# Patient Record
Sex: Male | Born: 1965 | Race: White | Hispanic: No | Marital: Married | Smoking: Never smoker
Health system: Southern US, Community
[De-identification: ages and names within clinical notes are randomized; demographics above are authoritative.]

## PROBLEM LIST (undated history)

## (undated) DIAGNOSIS — S21339A Puncture wound without foreign body of unspecified front wall of thorax with penetration into thoracic cavity, initial encounter: Secondary | ICD-10-CM

## (undated) DIAGNOSIS — W3400XA Accidental discharge from unspecified firearms or gun, initial encounter: Secondary | ICD-10-CM

## (undated) DIAGNOSIS — Z87442 Personal history of urinary calculi: Secondary | ICD-10-CM

## (undated) HISTORY — PX: APPENDECTOMY: SHX54

---
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
## 2008-03-28 ENCOUNTER — Ambulatory Visit (HOSPITAL_COMMUNITY): Admission: RE | Admit: 2008-03-28 | Discharge: 2008-03-28 | Payer: Self-pay | Admitting: Urology

## 2011-03-01 NOTE — Op Note (Signed)
NAME:  Peter Rice, Peter Rice              ACCOUNT NO.:  [id number]   MEDICAL RECORD NO.:  [id number]          PATIENT TYPE:  AMB   LOCATION:  DAY                          FACILITY:  [facility]   PHYSICIAN:  Courtney Paris, M.D.DATE OF BIRTH:  04-22-1966   DATE OF PROCEDURE:  03/28/2008  DATE OF DISCHARGE:                               OPERATIVE REPORT   PREOPERATIVE DIAGNOSIS:  Left distal ureteral stone.   POSTOPERATIVE DIAGNOSIS:  Left distal ureteral stone.   OPERATIONS:  Cysto, ureteroscopy with stone extraction.   ANESTHESIA:  General.   SURGEON:  Courtney Paris, M.D.   BRIEF HISTORY:  This 45 year old patient has had pain since March 25, 2008, in the left flank.  He had not had a previous stones.  He  presented with a 4-mm stone in his left distal ureter.  He was offered  ureteroscopy yesterday, but chose to try the past the stone on his own,  but came into the office today just not doing well.  He continued to  have nausea and vomiting and significant pain.  KUB showed the stone  still present in the left distal ureter.  He enters now for ureteroscopy  and stone extraction.  He does have tiny bilateral nonobstructing stones  as well.  This is his first stone has passed, however.   DESCRIPTION OF PROCEDURE:  The patient was placed on the operating table  in the dorsal lithotomy position.  After satisfactory induction of  general endotracheal anesthesia, he was prepped and draped with Betadine  in the usual sterile fashion.  A time-out was done and the patient's  identification and side were again reconfirmed.  He was given IV Cipro.  The panendoscope could not be inserted without dilating the meatus from  18-26 [place], and then the 24 panendoscope was inserted.  No anterior  strictures seen.  The posterior urethra was nonobstructing.  The bladder  was entered.  The stone was seen coming out of the left ureteral  orifice.  Using the ureteroscope, we teased the stone out  and removed it  intact.  I did not leave a stent.  The bladder was drained and the scope  removed.  There was some old blood clots present in the bladder from  behind the stone once it passed.  B&O suppository was inserted.  The  patient was taken to the recovery room in good condition to be later  discharged as an outpatient.      Courtney Paris, M.D.  Electronically Signed     HMK/MEDQ  D:  03/28/2008  T:  03/29/2008  Job:  [id number]

## 2011-07-14 LAB — BASIC METABOLIC PANEL
Chloride: 102
GFR calc Af Amer: 53 — ABNORMAL LOW
Potassium: 4.2
Sodium: 138

## 2011-07-14 LAB — CBC
HCT: 39.6
Hemoglobin: 13.7
MCV: 85.5
RBC: 4.63
WBC: 9.1

## 2015-08-03 ENCOUNTER — Other Ambulatory Visit: Payer: Self-pay | Admitting: Family Medicine

## 2015-08-03 DIAGNOSIS — R1084 Generalized abdominal pain: Secondary | ICD-10-CM

## 2015-08-04 ENCOUNTER — Other Ambulatory Visit: Payer: Self-pay | Admitting: Family Medicine

## 2015-08-04 DIAGNOSIS — R1084 Generalized abdominal pain: Secondary | ICD-10-CM

## 2015-08-06 ENCOUNTER — Other Ambulatory Visit: Payer: Self-pay

## 2015-08-10 ENCOUNTER — Ambulatory Visit
Admission: RE | Admit: 2015-08-10 | Discharge: 2015-08-10 | Disposition: A | Discharge status: Home / Self Care | Payer: BLUE CROSS/BLUE SHIELD | Source: Ambulatory Visit | Attending: Family Medicine | Admitting: Family Medicine

## 2015-08-10 ENCOUNTER — Other Ambulatory Visit: Payer: Self-pay | Admitting: Family Medicine

## 2015-08-10 DIAGNOSIS — R1084 Generalized abdominal pain: Secondary | ICD-10-CM

## 2019-02-12 ENCOUNTER — Encounter: Payer: BLUE CROSS/BLUE SHIELD | Admitting: Sports Medicine

## 2019-06-09 ENCOUNTER — Other Ambulatory Visit: Payer: Self-pay

## 2019-06-09 ENCOUNTER — Encounter (HOSPITAL_COMMUNITY): Payer: Self-pay | Admitting: Emergency Medicine

## 2019-06-09 ENCOUNTER — Emergency Department (HOSPITAL_COMMUNITY)
Admission: EM | Admit: 2019-06-09 | Discharge: 2019-06-10 | Disposition: A | Discharge status: Home / Self Care | Payer: BC Managed Care – PPO | Attending: Emergency Medicine | Admitting: Emergency Medicine

## 2019-06-09 DIAGNOSIS — N23 Unspecified renal colic: Secondary | ICD-10-CM | POA: Diagnosis not present

## 2019-06-09 DIAGNOSIS — R1032 Left lower quadrant pain: Secondary | ICD-10-CM | POA: Diagnosis present

## 2019-06-09 DIAGNOSIS — R823 Hemoglobinuria: Secondary | ICD-10-CM | POA: Diagnosis not present

## 2019-06-09 HISTORY — DX: Puncture wound without foreign body of unspecified front wall of thorax with penetration into thoracic cavity, initial encounter: S21.339A

## 2019-06-09 LAB — URINALYSIS, ROUTINE W REFLEX MICROSCOPIC
Bacteria, UA: NONE SEEN
Bilirubin Urine: NEGATIVE
Glucose, UA: NEGATIVE mg/dL
Ketones, ur: NEGATIVE mg/dL
Leukocytes,Ua: NEGATIVE
Nitrite: NEGATIVE
Protein, ur: NEGATIVE mg/dL
RBC / HPF: >50 RBC/hpf — ABNORMAL HIGH (ref 0–5)
Specific Gravity, Urine: 1.018 (ref 1.005–1.030)
pH: 6 (ref 5.0–8.0)

## 2019-06-09 MED ORDER — TAMSULOSIN HCL 0.4 MG PO CAPS
0.4000 mg | ORAL_CAPSULE | Freq: Once | ORAL | Status: AC
  Administered 2019-06-09: 0.4 mg via ORAL
  Filled 2019-06-09: qty 1
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
## 2019-06-09 NOTE — ED Triage Notes (Signed)
Pt reports left side flank pain with feeling similar pain to a kidney stone. Pt has prior hx to kidney stone.

## 2019-06-10 MED ORDER — ONDANSETRON HCL 4 MG PO TABS: 4.0000 mg | ORAL_TABLET | Freq: Four times a day (QID) | ORAL | 0 refills | Status: AC

## 2019-06-10 MED ORDER — HYDROCODONE-ACETAMINOPHEN 5-325 MG PO TABS: 1.0000 | ORAL_TABLET | Freq: Four times a day (QID) | ORAL | 0 refills | Status: DC | PRN

## 2019-06-10 MED ORDER — TAMSULOSIN HCL 0.4 MG PO CAPS: 0.4000 mg | ORAL_CAPSULE | Freq: Every day | ORAL | 0 refills | Status: AC

## 2019-06-10 NOTE — Discharge Instructions (Addendum)
Thank you for allowing me to care for you today in the Emergency Department.   Urine and symptoms today were strongly suggestive of a kidney stone.  Take 1 tablet of tamsulosin by mouth daily.  Your first dose was given tonight in the ER.  You can take 600 mg of ibuprofen with food or 650 mg of Tylenol once every 6 hours for mild to moderate pain.  For severe, uncontrollable pain, you can take 1 to 2 tablets of Norco every 6 hours as needed.  Do not work or drive while taking this medication as it can cause you to be impaired.  Do not take other sedating substances while taking this medication.  It is narcotic.  It can be addicting.  If you do pass the stone, you can use the urine strainer to catch it and save it for analysis.  Return to the ER if you develop uncontrollable pain, if you stop producing urine, if you develop high fevers or chills, persistent vomiting despite taking Zofran, or other new, concerning symptoms.

## 2019-06-11 ENCOUNTER — Other Ambulatory Visit: Payer: Self-pay | Admitting: Urology

## 2019-06-11 ENCOUNTER — Ambulatory Visit (HOSPITAL_COMMUNITY): Payer: BC Managed Care – PPO

## 2019-06-11 ENCOUNTER — Ambulatory Visit (HOSPITAL_COMMUNITY): Payer: BC Managed Care – PPO | Admitting: Anesthesiology

## 2019-06-11 ENCOUNTER — Encounter (HOSPITAL_COMMUNITY): Payer: Self-pay | Admitting: General Practice

## 2019-06-11 ENCOUNTER — Ambulatory Visit (HOSPITAL_COMMUNITY)
Admission: RE | Admit: 2019-06-11 | Discharge: 2019-06-11 | Disposition: A | Discharge status: Home / Self Care | Payer: BC Managed Care – PPO | Source: Other Acute Inpatient Hospital | Attending: Urology | Admitting: Urology

## 2019-06-11 ENCOUNTER — Other Ambulatory Visit: Payer: Self-pay

## 2019-06-11 ENCOUNTER — Encounter (HOSPITAL_COMMUNITY)
Admission: RE | Disposition: A | Discharge status: Home / Self Care | Payer: Self-pay | Source: Other Acute Inpatient Hospital | Attending: Urology

## 2019-06-11 ENCOUNTER — Encounter (HOSPITAL_COMMUNITY): Payer: Self-pay | Admitting: *Deleted

## 2019-06-11 ENCOUNTER — Other Ambulatory Visit (HOSPITAL_COMMUNITY)
Admission: RE | Admit: 2019-06-11 | Discharge: 2019-06-11 | Disposition: A | Discharge status: Home / Self Care | Payer: BC Managed Care – PPO | Source: Ambulatory Visit | Attending: Urology | Admitting: Urology

## 2019-06-11 DIAGNOSIS — N281 Cyst of kidney, acquired: Secondary | ICD-10-CM | POA: Diagnosis not present

## 2019-06-11 DIAGNOSIS — Z20828 Contact with and (suspected) exposure to other viral communicable diseases: Secondary | ICD-10-CM | POA: Insufficient documentation

## 2019-06-11 DIAGNOSIS — N35911 Unspecified urethral stricture, male, meatal: Secondary | ICD-10-CM | POA: Insufficient documentation

## 2019-06-11 DIAGNOSIS — Z87442 Personal history of urinary calculi: Secondary | ICD-10-CM | POA: Insufficient documentation

## 2019-06-11 DIAGNOSIS — N201 Calculus of ureter: Secondary | ICD-10-CM | POA: Diagnosis present

## 2019-06-11 DIAGNOSIS — N200 Calculus of kidney: Secondary | ICD-10-CM

## 2019-06-11 HISTORY — PX: CYSTOSCOPY W/ URETERAL STENT PLACEMENT: SHX1429

## 2019-06-11 HISTORY — DX: Personal history of urinary calculi: Z87.442

## 2019-06-11 LAB — SARS CORONAVIRUS 2 BY RT PCR (HOSPITAL ORDER, PERFORMED IN ~~LOC~~ HOSPITAL LAB): SARS Coronavirus 2: NEGATIVE

## 2019-06-11 SURGERY — CYSTOSCOPY, WITH RETROGRADE PYELOGRAM AND URETERAL STENT INSERTION
Anesthesia: General | Site: Ureter

## 2019-06-11 MED ORDER — PROPOFOL 10 MG/ML IV BOLUS
INTRAVENOUS | Status: DC | PRN
  Administered 2019-06-11: 180 mg via INTRAVENOUS

## 2019-06-11 MED ORDER — MIDAZOLAM HCL 2 MG/2ML IJ SOLN
INTRAMUSCULAR | Status: AC
  Filled 2019-06-11: qty 2

## 2019-06-11 MED ORDER — HYDROMORPHONE HCL 1 MG/ML IJ SOLN: 0.2500 mg | INTRAMUSCULAR | Status: DC | PRN

## 2019-06-11 MED ORDER — FENTANYL CITRATE (PF) 100 MCG/2ML IJ SOLN
INTRAMUSCULAR | Status: DC | PRN
  Administered 2019-06-11 (×2): 50 ug via INTRAVENOUS

## 2019-06-11 MED ORDER — TAMSULOSIN HCL 0.4 MG PO CAPS: 0.4000 mg | ORAL_CAPSULE | Freq: Every day | ORAL | 0 refills | Status: DC

## 2019-06-11 MED ORDER — ONDANSETRON HCL 4 MG/2ML IJ SOLN: 4.0000 mg | Freq: Once | INTRAMUSCULAR | Status: DC | PRN

## 2019-06-11 MED ORDER — CIPROFLOXACIN HCL 500 MG PO TABS: 500.0000 mg | ORAL_TABLET | Freq: Once | ORAL | 0 refills | Status: AC

## 2019-06-11 MED ORDER — PROPOFOL 10 MG/ML IV BOLUS
INTRAVENOUS | Status: AC
  Filled 2019-06-11: qty 20

## 2019-06-11 MED ORDER — CEFAZOLIN SODIUM-DEXTROSE 2-4 GM/100ML-% IV SOLN
2.0000 g | INTRAVENOUS | Status: DC
  Filled 2019-06-11: qty 100

## 2019-06-11 MED ORDER — SODIUM CHLORIDE 0.9 % IV SOLN
INTRAVENOUS | Status: DC | PRN
  Administered 2019-06-11: 4 mL

## 2019-06-11 MED ORDER — MIDAZOLAM HCL 5 MG/5ML IJ SOLN
INTRAMUSCULAR | Status: DC | PRN
  Administered 2019-06-11: 2 mg via INTRAVENOUS

## 2019-06-11 MED ORDER — FENTANYL CITRATE (PF) 100 MCG/2ML IJ SOLN
INTRAMUSCULAR | Status: AC
  Filled 2019-06-11: qty 2

## 2019-06-11 MED ORDER — LACTATED RINGERS IV SOLN
INTRAVENOUS | Status: DC
  Administered 2019-06-11: 16:00:00 via INTRAVENOUS

## 2019-06-11 MED ORDER — LIDOCAINE 2% (20 MG/ML) 5 ML SYRINGE
INTRAMUSCULAR | Status: DC | PRN
  Administered 2019-06-11: 50 mg via INTRAVENOUS

## 2019-06-11 MED ORDER — TRAMADOL HCL 50 MG PO TABS: 50.0000 mg | ORAL_TABLET | Freq: Four times a day (QID) | ORAL | 0 refills | Status: AC | PRN

## 2019-06-11 MED ORDER — MEPERIDINE HCL 50 MG/ML IJ SOLN: 6.2500 mg | INTRAMUSCULAR | Status: DC | PRN

## 2019-06-11 MED ORDER — SODIUM CHLORIDE 0.9 % IR SOLN
Status: DC | PRN
  Administered 2019-06-11: 1000 mL

## 2019-06-11 MED ORDER — ONDANSETRON HCL 4 MG/2ML IJ SOLN
INTRAMUSCULAR | Status: DC | PRN
  Administered 2019-06-11: 4 mg via INTRAVENOUS

## 2019-06-11 MED ORDER — DEXAMETHASONE SODIUM PHOSPHATE 10 MG/ML IJ SOLN
INTRAMUSCULAR | Status: DC | PRN
  Administered 2019-06-11: 10 mg via INTRAVENOUS

## 2019-06-11 SURGICAL SUPPLY — 24 items
APL SKNCLS STERI-STRIP NONHPOA (GAUZE/BANDAGES/DRESSINGS) ×1
BAG URO CATCHER STRL LF (MISCELLANEOUS) ×3 IMPLANT
BASKET ZERO TIP NITINOL 2.4FR (BASKET) IMPLANT
BENZOIN TINCTURE PRP APPL 2/3 (GAUZE/BANDAGES/DRESSINGS) ×2 IMPLANT
BSKT STON RTRVL ZERO TP 2.4FR (BASKET)
CATH URET 5FR 28IN OPEN ENDED (CATHETERS) ×3 IMPLANT
CLOTH BEACON ORANGE TIMEOUT ST (SAFETY) ×3 IMPLANT
COVER WAND RF STERILE (DRAPES) IMPLANT
DRSG TEGADERM 2-3/8X2-3/4 SM (GAUZE/BANDAGES/DRESSINGS) ×3 IMPLANT
EXTRACTOR STONE 1.7FRX115CM (UROLOGICAL SUPPLIES) IMPLANT
GLOVE BIOGEL M STRL SZ7.5 (GLOVE) ×3 IMPLANT
GOWN STRL REUS W/TWL XL LVL3 (GOWN DISPOSABLE) ×3 IMPLANT
GUIDEWIRE ANG ZIPWIRE 038X150 (WIRE) IMPLANT
GUIDEWIRE STR DUAL SENSOR (WIRE) ×3 IMPLANT
KIT TURNOVER KIT A (KITS) IMPLANT
MANIFOLD NEPTUNE II (INSTRUMENTS) ×3 IMPLANT
PACK CYSTO (CUSTOM PROCEDURE TRAY) ×3 IMPLANT
SHEATH URETERAL 12FRX28CM (UROLOGICAL SUPPLIES) IMPLANT
SHEATH URETERAL 12FRX35CM (MISCELLANEOUS) IMPLANT
STENT URET 6FRX28 CONTOUR (STENTS) ×2 IMPLANT
TUBING CONNECTING 10 (TUBING) ×2 IMPLANT
TUBING CONNECTING 10' (TUBING) ×1
TUBING UROLOGY SET (TUBING) ×3 IMPLANT
WIRE COONS/BENSON .038X145CM (WIRE) IMPLANT

## 2019-06-11 NOTE — Anesthesia Preprocedure Evaluation (Signed)
Anesthesia Evaluation  Patient identified by MRN, date of birth, ID band Patient awake    Reviewed: Allergy & Precautions, NPO status , Patient's Chart, lab work & pertinent test results  Airway Mallampati: I  TM Distance: >3 FB Neck ROM: Full    Dental   Pulmonary    Pulmonary exam normal        Cardiovascular Normal cardiovascular exam     Neuro/Psych    GI/Hepatic   Endo/Other    Renal/GU      Musculoskeletal   Abdominal   Peds  Hematology   Anesthesia Other Findings   Reproductive/Obstetrics                             Anesthesia Physical Anesthesia Plan  ASA: II  Anesthesia Plan: General   Post-op Pain Management:    Induction: Intravenous  PONV Risk Score and Plan: 2  Airway Management Planned: LMA  Additional Equipment:   Intra-op Plan:   Post-operative Plan: Extubation in OR  Informed Consent: I have reviewed the patients History and Physical, chart, labs and discussed the procedure including the risks, benefits and alternatives for the proposed anesthesia with the patient or authorized representative who has indicated his/her understanding and acceptance.     Plan Discussed with: CRNA and Surgeon  Anesthesia Plan Comments:         Anesthesia Quick Evaluation  

## 2019-06-11 NOTE — Op Note (Signed)
Preoperative diagnosis:  1. Obstructing left UPJ stone with uncontrolled pain 2. Urethral meatus stenosis   Postoperative diagnosis:  1. same   Procedure:  1. Cystoscopy 2. left ureteral stent placement 3. left retrograde pyelography with interpretation  4. Urethral meatus dilation  Surgeon: Ardis Hughs, MD  Anesthesia: General  Complications: None  Intraoperative findings:  left retrograde pyelography demonstrated a filling defect within the left  UPJ consistent with the patient's known calculus without other abnormalities.  Stone was pushed back into the renal pelvis. Meatus to narrow to accommodate cystoscope and was dilated to 19f.  EBL: Minimal  Specimens: None  Indication: Peter Rice is a 53 y.o. patient with poorly controlled pain from a left UPJ stone. After reviewing the management options for treatment, he elected to proceed with the above surgical procedure(s). We have discussed the potential benefits and risks of the procedure, side effects of the proposed treatment, the likelihood of the patient achieving the goals of the procedure, and any potential problems that might occur during the procedure or recuperation. Informed consent has been obtained.  Description of procedure:  The patient was taken to the operating room and general anesthesia was induced.  The patient was placed in the dorsal lithotomy position, prepped and draped in the usual sterile fashion, and preoperative antibiotics were administered. A preoperative time-out was performed.   Unable to advance the 38f cystoscopy I dilated his urethral meatus using VanBuren sounds in a serial fashion starting at 56F and dilating upto 43F.  Cystourethroscopy was performed.  The patient's urethra was examined and was normal and demonstrated bilobar prostatic hypertrophy. The bladder was then systematically examined in its entirety. There was no evidence for any bladder tumors, stones, or other mucosal  pathology.    Attention then turned to the leftureteral orifice and a ureteral catheter was used to intubate the ureteral orifice.  Omnipaque contrast was injected through the ureteral catheter and a retrograde pyelogram was performed with findings as dictated above.  A 0.38 sensor guidewire was then advanced up the left ureter into the renal pelvis under fluoroscopic guidance.  The wire was then backloaded through the cystoscope and a ureteral stent was advance over the wire using Seldinger technique.  The stent was positioned appropriately under fluoroscopic and cystoscopic guidance.  The wire was then removed with an adequate stent curl noted in the renal pelvis as well as in the bladder.  The bladder was then emptied and the procedure ended.  The patient appeared to tolerate the procedure well and without complications.  The patient was able to be awakened and transferred to the recovery unit in satisfactory condition.    Ardis Hughs, M.D.

## 2019-06-11 NOTE — H&P (Signed)
I have pain in the flank.  HPI: Peter Rice is a 53 year-old male established patient who is here for flank pain.  06/10/19: The patient is seen today having been seen in the emergency room last night. He had some mild left lower quadrant pain that developed yesterday and then it became very severe and was not associated with any ability to get relief by change in position and notes that the pain is similar to his previous stone that he had in 6/0 9. At that time a CT scan revealed bilateral, small renal calculi. He has not had any hematuria nor has a seen any change in his voiding pattern.  A renal ultrasound in 10/16 revealed a 6 mm stone in the left kidney as well as bilateral renal cysts.  He is mainly bothered by pain in the left lower quadrant now. No hematuria. He said the pain is identical to what he had experienced with his previous stone but he said that was more in the flank and this is more in the left lower quadrant.   06/11/19: Patient with above noted hx. CT imaging from 8/24 revealed a 10 mm wide by 11 mm long stone located at the UPJ on the left. He has elected proceed with lithotripsy, which has not been scheduled yet. He presents today with refractory left flank pain. He states that his pain became more acute at approximately 1:00 a.m.. He used to hydrocodone-acetaminophen 5-325 at that time with limited benefit. He repeated dose of pain medicine at approximately 5:30 a.m.. However, he states that pain continues to progress. He currently states pain at an 11/10. He has had some intermittent episodes of nausea but denies vomiting. He has had some chills, but denies measurable fever. He denies exacerbation of lower urinary tract symptoms or dysuria. He has had prior URS in 2009.   The problem is on the left side. His pain started about 06/09/2019. The pain is sharp. The pain is intermittent. The pain does radiate. The intensity of his pain is rated as a 10.   None< makes the pain better.  Nothing causes the pain to become worse. He was treated with the following pain medication(s): Hydrocodone.   He has had this same pain previously. He has had kidney stones. This condition would be considered of mild to moderate severity with no modifying factors or associated signs or symptoms other than as noted above.     ALLERGIES: No Allergies    MEDICATIONS: Tamsulosin Hcl  Hydrocodone-Acetaminophen 5 mg-325 mg tablet  Naproxen 1 PO Daily     GU PSH: Ureteroscopic stone removal - 2009       PSH Notes: Cystoscopy With Ureteroscopy With Removal Of Calculus, Sinus Surgery, Abdominal Surgery, Hand Surgery, Tonsillectomy, Appendectomy, Thoracic Repair, gum surgery, wisdom tooth extraction   NON-GU PSH: Appendectomy - 2009 Remove Tonsils - 2009     GU PMH: Flank Pain (Acute), Left, His flank pain and left lower quadrant pain is secondary to his left UPJ stone. - 06/10/2019 Renal cyst, Bilateral, His bilateral renal cysts appear simple - 06/10/2019 LLQ pain, Abdominal pain, LLQ (left lower quadrant) - 2014 Renal calculus, Bilateral kidney stones - 2014 Ureteral calculus, Calculus of ureter - 2014      PMH Notes:  1898-10-17 00:00:00 - Note: Normal Routine History And Physical Adult   NON-GU PMH: Nausea, Nausea - 2014 Other constipation, Chronic Constipation - 2014 GERD    FAMILY HISTORY: None    Notes: Mother still living at  age 23  Father still living at age 66  Has 3 daughters   SOCIAL HISTORY: Marital Status: Married Current Smoking Status: Patient has never smoked.   Tobacco Use Assessment Completed: Used Tobacco in last 30 days? Does drink.  Patient's occupation Museum/gallery conservator.     Notes: Caffeine Use   REVIEW OF SYSTEMS:    GU Review Male:   Patient denies frequent urination, get up at night to urinate, erection problems, stream starts and stops, hard to postpone urination, leakage of urine, have to strain to urinate , trouble starting your stream, burning/  pain with urination, and penile pain.  Gastrointestinal (Upper):   Patient denies nausea, vomiting, and indigestion/ heartburn.  Gastrointestinal (Lower):   Patient denies diarrhea and constipation.  Constitutional:   Patient denies fever, night sweats, weight loss, and fatigue.  Skin:   Patient denies skin rash/ lesion and itching.  Eyes:   Patient denies blurred vision and double vision.  Ears/ Nose/ Throat:   Patient denies sore throat and sinus problems.  Hematologic/Lymphatic:   Patient denies swollen glands and easy bruising.  Cardiovascular:   Patient denies leg swelling and chest pains.  Respiratory:   Patient denies cough and shortness of breath.  Endocrine:   Patient denies excessive thirst.  Musculoskeletal:   Patient denies back pain and joint pain.  Neurological:   Patient denies headaches and dizziness.  Psychologic:   Patient denies depression and anxiety.   Notes: left flank and LLQ abdominal pain unrelieved by pain medicine (last dose taken at 0530)    VITAL SIGNS:      06/11/2019 09:17 AM  BP 137/80 mmHg  Pulse 66 /min  Temperature 96.8 F / 36 C   MULTI-SYSTEM PHYSICAL EXAMINATION:    Constitutional: Well-nourished. No physical deformities. Normally developed. Good grooming. Appears uncomfortable. Pacing around the room.   Respiratory: No labored breathing, no use of accessory muscles.   Cardiovascular: Normal temperature, normal extremity pulses, no swelling, no varicosities.  Neurologic / Psychiatric: Oriented to time, oriented to place, oriented to person. No depression, no anxiety, no agitation.  Gastrointestinal: No mass, no tenderness, no rigidity, non obese abdomen. Left CVAT.   Musculoskeletal: Normal gait and station of head and neck.     PAST DATA REVIEWED:  Source Of History:  Patient  Records Review:   Previous Patient Records  Urine Test Review:   Urinalysis  X-Ray Review: C.T. Abdomen/Pelvis: Reviewed Films. Reviewed Report.     PROCEDURES:           Urinalysis w/Scope Dipstick Dipstick Cont'd Micro  Color: Amber Bilirubin: Neg mg/dL WBC/hpf: 0 - 5/hpf  Appearance: Cloudy Ketones: Neg mg/dL RBC/hpf: >09/QZR  Specific Gravity: 1.020 Blood: 3+ ery/uL Bacteria: Few (10-25/hpf)  pH: 6.5 Protein: Trace mg/dL Cystals: NS (Not Seen)  Glucose: Neg mg/dL Urobilinogen: 0.2 mg/dL Casts: NS (Not Seen)    Nitrites: Neg Trichomonas: Not Present    Leukocyte Esterase: Trace leu/uL Mucous: Not Present      Epithelial Cells: NS (Not Seen)      Yeast: NS (Not Seen)      Sperm: Not Present         Morphine 8mg  - [id number], [id number] two separate injections- angel gave morphine on the left buttock and cj on the right buttock. patient laid for 15 min and tolerated well.    Qty: 8 Adm. By: Samara Deist  Unit: mg Lot No [id number]  Route: IM Exp. Date 08/17/2020  Freq: None Mfgr.: [id number]  Site: bilateral  Phenergan 25mg  - [id number], [id number] The injection site was sterilely prepped with alcohol. Phenergan was injected (IM) using standard technique. The patient tolerated the procedure well. A band aid was applied. The site was dry when the patient left the exam room.   After treatment was administered, patient was observed to be symptom-free for 10-15 minutes.   Qty: 25 Adm. By: Beryle QuantAngel Bryant  Unit: mg Lot No [id number]  Route: IM Exp. Date 07/18/2019  Freq: None Mfgr.:   Site: Right Buttock   ASSESSMENT:      ICD-10 Details  1 GU:   Flank Pain - R10.84   2   Renal calculus - N20.0    PLAN:            Medications New Meds: Hydromorphone Hcl 4 mg tablet 1 tablet PO Q 4 H   #20  0 Refill(s)            Orders Labs Urine Culture          Schedule Procedure: 06/11/2019 at [facility] - [phone] - Phenergan 25mg  (Phenergan Per 50 Mg) - [id number], [id number]  Procedure: 06[facility]11/2019 at [facility] - [phone] - Morphine 4mg  (Ther/Proph/Diag Inj, /Im) - [id number], [id number]          Document Letter(s):  Created  for Patient: Clinical Summary         Notes:   I will send urine for culture today. Patient was given injections of morphine and Finder urine in clinic today with limited benefit in symptoms. I reviewed options with the patient in detail today. He would like for intervention to be considered given refractory pain. Case reviewed with the on-call urologist. Will plan to move forward with cystoscopy and left ureteral stent placement. I reviewed the procedure with the patient today. He has had ureteroscopy prior. We will plan to also get him set up for left ESWL later this week for definitive treatment of his left UPJ calculus. He will remain NPO and present for procedure later this afternoon.

## 2019-06-11 NOTE — Anesthesia Procedure Notes (Signed)
Procedure Name: LMA Insertion Date/Time: 06/11/2019 4:26 PM Performed by: Anne Fu, CRNA Pre-anesthesia Checklist: Patient identified, Emergency Drugs available, Suction available, Patient being monitored and Timeout performed Patient Re-evaluated:Patient Re-evaluated prior to induction Oxygen Delivery Method: Circle system utilized Preoxygenation: Pre-oxygenation with 100% oxygen Induction Type: IV induction Ventilation: Mask ventilation without difficulty LMA: LMA inserted LMA Size: 4.0 Number of attempts: 1 Placement Confirmation: positive ETCO2 and breath sounds checked- equal and bilateral Tube secured with: Tape

## 2019-06-11 NOTE — Anesthesia Postprocedure Evaluation (Signed)
Anesthesia Post Note  Patient: Peter Rice  Procedure(s) Performed: CYSTOSCOPY WITH LEFT RETROGRADE PYELOGRAM/URETERAL STENT PLACEMENT (N/A Ureter)     Patient location during evaluation: PACU Anesthesia Type: General Level of consciousness: awake and alert Pain management: pain level controlled Vital Signs Assessment: post-procedure vital signs reviewed and stable Respiratory status: spontaneous breathing, nonlabored ventilation, respiratory function stable and patient connected to nasal cannula oxygen Cardiovascular status: blood pressure returned to baseline and stable Postop Assessment: no apparent nausea or vomiting Anesthetic complications: no    Last Vitals:  Vitals:   06/11/19 1715 06/11/19 1730  BP: (!) 132/92 138/90  Pulse: 83 86  Resp: 16 15  Temp:  36.5 C  SpO2: 98% 98%    Last Pain:  Vitals:   06/11/19 1730  TempSrc:   PainSc: 0-No pain                 Shaft Corigliano DAVID

## 2019-06-11 NOTE — Transfer of Care (Signed)
Immediate Anesthesia Transfer of Care Note  Patient: Peter Rice  Procedure(s) Performed: Procedure(s): CYSTOSCOPY WITH LEFT RETROGRADE PYELOGRAM/URETERAL STENT PLACEMENT (N/A)  Patient Location: PACU  Anesthesia Type:General  Level of Consciousness:  sedated, patient cooperative and responds to stimulation  Airway & Oxygen Therapy:Patient Spontanous Breathing and Patient connected to face mask oxgen  Post-op Assessment:  Report given to PACU RN and Post -op Vital signs reviewed and stable  Post vital signs:  Reviewed and stable  Last Vitals:  Vitals:   06/11/19 1507  BP: (!) 144/103  Pulse: 91  Resp: 20  Temp: 36.8 C  SpO2: 825%    Complications: No apparent anesthesia complications

## 2019-06-11 NOTE — Interval H&P Note (Signed)
History and Physical Interval Note:  06/11/2019 4:06 PM  Peter Rice  has presented today for surgery, with the diagnosis of left ureteral calculus.  The various methods of treatment have been discussed with the patient and family. After consideration of risks, benefits and other options for treatment, the patient has consented to  Procedure(s): CYSTOSCOPY WITH LEFT RETROGRADE PYELOGRAM/URETERAL STENT PLACEMENT (N/A) as a surgical intervention.  The patient's history has been reviewed, patient examined, no change in status, stable for surgery.  I have reviewed the patient's chart and labs.  Questions were answered to the patient's satisfaction.     Ardis Hughs

## 2019-06-11 NOTE — Discharge Instructions (Signed)
DISCHARGE INSTRUCTIONS FOR KIDNEY STONE/URETERAL STENT   MEDICATIONS:  1. Resume all your other meds from home - except do not take any extra narcotic pain meds that you may have at home.  2. Tramadol is for moderate/severe pain, otherwise taking upto 1000 mg every 6 hours of plainTylenol will help treat your pain.   3. Take Cipro one hour prior to removal of your stent.   ACTIVITY:  1. No strenuous activity x 1week  2. No driving while on narcotic pain medications  3. Drink plenty of water  4. Continue to walk at home - you can still get blood clots when you are at home, so keep active, but don't over do it.  5. May return to work/school tomorrow or when you feel ready   BATHING:  1. You can shower and we recommend daily showers  2. You have a string coming from your urethra: The stent string is attached to your ureteral stent. Do not pull on this.   SIGNS/SYMPTOMS TO CALL:  Please call Peter Rice if you have a fever greater than 101.5, uncontrolled nausea/vomiting, uncontrolled pain, dizziness, unable to urinate, bloody urine, chest pain, shortness of breath, leg swelling, leg pain, redness around wound, drainage from wound, or any other concerns or questions.   You can reach Peter Rice at [phone].   FOLLOW-UP:  1. You have an appointment for lithotripsy of your stone on Thursday. 2. You have a string attached to your stent, you may remove it on Monday, August 31. To do this, pull the strings until the stents are completely removed. You may feel an odd sensation in your back.   General Anesthesia, Adult, Care After This sheet gives you information about how to care for yourself after your procedure. Your health care provider may also give you more specific instructions. If you have problems or questions, contact your health care provider. What can I expect after the procedure? After the procedure, the following side effects are common:  Pain or discomfort at the IV  site.  Nausea.  Vomiting.  Sore throat.  Trouble concentrating.  Feeling cold or chills.  Weak or tired.  Sleepiness and fatigue.  Soreness and body aches. These side effects can affect parts of the body that were not involved in surgery. Follow these instructions at home:  For at least 24 hours after the procedure:  Have a responsible adult stay with you. It is important to have someone help care for you until you are awake and alert.  Rest as needed.  Do not: ? Participate in activities in which you could fall or become injured. ? Drive. ? Use heavy machinery. ? Drink alcohol. ? Take sleeping pills or medicines that cause drowsiness. ? Make important decisions or sign legal documents. ? Take care of children on your own. Eating and drinking  Follow any instructions from your health care provider about eating or drinking restrictions.  When you feel hungry, start by eating small amounts of foods that are soft and easy to digest (bland), such as toast. Gradually return to your regular diet.  Drink enough fluid to keep your urine pale yellow.  If you vomit, rehydrate by drinking water, juice, or clear broth. General instructions  If you have sleep apnea, surgery and certain medicines can increase your risk for breathing problems. Follow instructions from your health care provider about wearing your sleep device: ? Anytime you are sleeping, including during daytime naps. ? While taking prescription pain medicines, sleeping medicines, or medicines  that make you drowsy.  Return to your normal activities as told by your health care provider. Ask your health care provider what activities are safe for you.  Take over-the-counter and prescription medicines only as told by your health care provider.  If you smoke, do not smoke without supervision.  Keep all follow-up visits as told by your health care provider. This is important. Contact a health care provider if:  You  have nausea or vomiting that does not get better with medicine.  You cannot eat or drink without vomiting.  You have pain that does not get better with medicine.  You are unable to pass urine.  You develop a skin rash.  You have a fever.  You have redness around your IV site that gets worse. Get help right away if:  You have difficulty breathing.  You have chest pain.  You have blood in your urine or stool, or you vomit blood. Summary  After the procedure, it is common to have a sore throat or nausea. It is also common to feel tired.  Have a responsible adult stay with you for the first 24 hours after general anesthesia. It is important to have someone help care for you until you are awake and alert.  When you feel hungry, start by eating small amounts of foods that are soft and easy to digest (bland), such as toast. Gradually return to your regular diet.  Drink enough fluid to keep your urine pale yellow.  Return to your normal activities as told by your health care provider. Ask your health care provider what activities are safe for you. This information is not intended to replace advice given to you by your health care provider. Make sure you discuss any questions you have with your health care provider. Document Released: 01/09/2001 Document Revised: 10/06/2017 Document Reviewed: 05/19/2017 Elsevier Patient Education  2020 [employer]

## 2019-06-12 ENCOUNTER — Encounter (HOSPITAL_COMMUNITY): Payer: Self-pay | Admitting: Urology

## 2019-06-12 NOTE — H&P (Signed)
Office Visit Report     06/11/2019   --------------------------------------------------------------------------------   Peter Rice  MRN: [id number]  DOB: 11/27/1965, 53 year old Male  SSN: [id number]   PRIMARY CARE:    REFERRING:  Jama Flavors. Redmond Pulling, MD  PROVIDER:  Kathie Rhodes, M.D.  TREATING:  Azucena Fallen, NP  LOCATION:  [facility] [phone]     --------------------------------------------------------------------------------   CC: I have pain in the flank.  HPI: Peter Rice is a 53 year-old male established patient who is here for flank pain.  06/10/19: The patient is seen today having been seen in the emergency room last night. He had some mild left lower quadrant pain that developed yesterday and then it became very severe and was not associated with any ability to get relief by change in position and notes that the pain is similar to his previous stone that he had in 6/0 9. At that time a CT scan revealed bilateral, small renal calculi. He has not had any hematuria nor has a seen any change in his voiding pattern.  A renal ultrasound in 10/16 revealed a 6 mm stone in the left kidney as well as bilateral renal cysts.  He is mainly bothered by pain in the left lower quadrant now. No hematuria. He said the pain is identical to what he had experienced with his previous stone but he said that was more in the flank and this is more in the left lower quadrant.   06/11/19: Patient with above noted hx. CT imaging from 8/24 revealed a 10 mm wide by 11 mm long stone located at the UPJ on the left. He has elected proceed with lithotripsy, which has not been scheduled yet. He presents today with refractory left flank pain. He states that his pain became more acute at approximately 1:00 a.m.. He used to hydrocodone-acetaminophen 5-325 at that time with limited benefit. He repeated dose of pain medicine at approximately 5:30 a.m.. However, he states that pain continues to progress.  He currently states pain at an 11/10. He has had some intermittent episodes of nausea but denies vomiting. He has had some chills, but denies measurable fever. He denies exacerbation of lower urinary tract symptoms or dysuria. He has had prior URS in 2009.   The problem is on the left side. His pain started about 06/09/2019. The pain is sharp. The pain is intermittent. The pain does radiate. The intensity of his pain is rated as a 10.   None< makes the pain better. Nothing causes the pain to become worse. He was treated with the following pain medication(s): Hydrocodone.   He has had this same pain previously. He has had kidney stones. This condition would be considered of mild to moderate severity with no modifying factors or associated signs or symptoms other than as noted above.     ALLERGIES: No Allergies    MEDICATIONS: Tamsulosin Hcl  Hydrocodone-Acetaminophen 5 mg-325 mg tablet  Naproxen 1 PO Daily     GU PSH: Ureteroscopic stone removal - 2009       PSH Notes: Cystoscopy With Ureteroscopy With Removal Of Calculus, Sinus Surgery, Abdominal Surgery, Hand Surgery, Tonsillectomy, Appendectomy, Thoracic Repair, gum surgery, wisdom tooth extraction   NON-GU PSH: Appendectomy - 2009 Remove Tonsils - 2009     GU PMH: Flank Pain (Acute), Left, His flank pain and left lower quadrant pain is secondary to his left UPJ stone. - 06/10/2019 Renal cyst, Bilateral, His bilateral renal cysts appear simple -  06/10/2019 LLQ pain, Abdominal pain, LLQ (left lower quadrant) - 2014 Renal calculus, Bilateral kidney stones - 2014 Ureteral calculus, Calculus of ureter - 2014      PMH Notes:  1898-10-17 00:00:00 - Note: Normal Routine History And Physical Adult   NON-GU PMH: Nausea, Nausea - 2014 Other constipation, Chronic Constipation - 2014 GERD    FAMILY HISTORY: None    Notes: Mother still living at age 59  Father still living at age 87  Has 3 daughters   SOCIAL HISTORY: Marital Status:  Married Current Smoking Status: Patient has never smoked.   Tobacco Use Assessment Completed: Used Tobacco in last 30 days? Does drink.  Patient's occupation Museum/gallery conservator.     Notes: Caffeine Use   REVIEW OF SYSTEMS:    GU Review Male:   Patient denies frequent urination, get up at night to urinate, erection problems, stream starts and stops, hard to postpone urination, leakage of urine, have to strain to urinate , trouble starting your stream, burning/ pain with urination, and penile pain.  Gastrointestinal (Upper):   Patient denies nausea, vomiting, and indigestion/ heartburn.  Gastrointestinal (Lower):   Patient denies diarrhea and constipation.  Constitutional:   Patient denies fever, night sweats, weight loss, and fatigue.  Skin:   Patient denies skin rash/ lesion and itching.  Eyes:   Patient denies blurred vision and double vision.  Ears/ Nose/ Throat:   Patient denies sore throat and sinus problems.  Hematologic/Lymphatic:   Patient denies swollen glands and easy bruising.  Cardiovascular:   Patient denies leg swelling and chest pains.  Respiratory:   Patient denies cough and shortness of breath.  Endocrine:   Patient denies excessive thirst.  Musculoskeletal:   Patient denies back pain and joint pain.  Neurological:   Patient denies headaches and dizziness.  Psychologic:   Patient denies depression and anxiety.   Notes: left flank and LLQ abdominal pain unrelieved by pain medicine (last dose taken at 0530)    VITAL SIGNS:      06/11/2019 09:17 AM  BP 137/80 mmHg  Pulse 66 /min  Temperature 96.8 F / 36 C   MULTI-SYSTEM PHYSICAL EXAMINATION:    Constitutional: Well-nourished. No physical deformities. Normally developed. Good grooming. Appears uncomfortable. Pacing around the room.   Respiratory: No labored breathing, no use of accessory muscles.   Cardiovascular: Normal temperature, normal extremity pulses, no swelling, no varicosities.  Neurologic / Psychiatric:  Oriented to time, oriented to place, oriented to person. No depression, no anxiety, no agitation.  Gastrointestinal: No mass, no tenderness, no rigidity, non obese abdomen. Left CVAT.   Musculoskeletal: Normal gait and station of head and neck.     PAST DATA REVIEWED:  Source Of History:  Patient  Records Review:   Previous Patient Records  Urine Test Review:   Urinalysis  X-Ray Review: C.T. Abdomen/Pelvis: Reviewed Films. Reviewed Report.     PROCEDURES:          Urinalysis w/Scope Dipstick Dipstick Cont'd Micro  Color: Amber Bilirubin: Neg mg/dL WBC/hpf: 0 - 5/hpf  Appearance: Cloudy Ketones: Neg mg/dL RBC/hpf: >22/QJF  Specific Gravity: 1.020 Blood: 3+ ery/uL Bacteria: Few (10-25/hpf)  pH: 6.5 Protein: Trace mg/dL Cystals: NS (Not Seen)  Glucose: Neg mg/dL Urobilinogen: 0.2 mg/dL Casts: NS (Not Seen)    Nitrites: Neg Trichomonas: Not Present    Leukocyte Esterase: Trace leu/uL Mucous: Not Present      Epithelial Cells: NS (Not Seen)      Yeast: NS (Not Seen)  Sperm: Not Present         Morphine 8mg  - [id number], J2270 two separate injections- angel gave morphine on the left buttock and cj on the right buttock. patient laid for 15 min and tolerated well.    Qty: 8 Adm. By: Samara Deistindy Jackson  Unit: mg Lot No [id number]  Route: IM Exp. Date 08/17/2020  Freq: None Mfgr.: [id number]  Site: bilateral         Phenergan 25mg  - [id number], J2550 The injection site was sterilely prepped with alcohol. Phenergan was injected (IM) using standard technique. The patient tolerated the procedure well. A band aid was applied. The site was dry when the patient left the exam room.   After treatment was administered, patient was observed to be symptom-free for 10-15 minutes.   Qty: 25 Adm. By: Beryle QuantAngel Bryant  Unit: mg Lot No [id number]  Route: IM Exp. Date 07/18/2019  Freq: None Mfgr.:   Site: Right Buttock   ASSESSMENT:      ICD-10 Details  1 GU:   Flank Pain - R10.84   2   Renal calculus -  N20.0    PLAN:            Medications New Meds: Hydromorphone Hcl 4 mg tablet 1 tablet PO Q 4 H   #20  0 Refill(s)            Orders Labs Urine Culture          Schedule Procedure: 06/11/2019 at [facility] - [phone] - Phenergan 25mg  (Phenergan Per 50 Mg) - J2550, [phone]  [facility]: [facility] - [phone] - Morphine 4mg  (Ther/Proph/Diag Inj, Wagram/Im) - [id number], [id number]          Document Letter(s):  Created for Patient: Clinical Summary         Notes:   I will send urine for culture today. Patient was given injections of morphine and phenergan clinic today with limited benefit in symptoms. I reviewed options with the patient in detail today. He would like for intervention to be considered given refractory pain. Case reviewed with the on-call urologist. Will plan to move forward with cystoscopy and left ureteral stent placement later today. We will also plan to get him set up for ESWL later this week, if possible, for treatment of left UPJ calculus. I reviewed the procedure with the patient today. He has had ureteroscopy prior. He will remain NPO and present for cystoscopy with left ureteral stent placement later today.   ** Dr. Vernie Ammonsttelin was sent a task about this patient prior to his OV with me and sent in Rx for Hydromorphone. **        Next Appointment:      Next Appointment: 06/13/2019 08:45 AM    Appointment Type: Surgery     Location: [facility] [phone]    Provider: Heloise PurpuraLester Daruis Swaim, M.D.    Reason for Visit: WL/OP LEFT ESWL      * Signed by Vianne BullsBree Fleck, NP on 06/11/19 at 6:08 PM (EDT)*

## 2019-06-13 ENCOUNTER — Encounter (HOSPITAL_COMMUNITY)
Admission: RE | Disposition: A | Discharge status: Home / Self Care | Payer: Self-pay | Source: Other Acute Inpatient Hospital | Attending: Urology

## 2019-06-13 ENCOUNTER — Ambulatory Visit (HOSPITAL_COMMUNITY): Payer: BC Managed Care – PPO

## 2019-06-13 ENCOUNTER — Other Ambulatory Visit: Payer: Self-pay

## 2019-06-13 ENCOUNTER — Ambulatory Visit (HOSPITAL_COMMUNITY)
Admission: RE | Admit: 2019-06-13 | Discharge: 2019-06-13 | Disposition: A | Discharge status: Home / Self Care | Payer: BC Managed Care – PPO | Source: Other Acute Inpatient Hospital | Attending: Urology | Admitting: Urology

## 2019-06-13 ENCOUNTER — Encounter (HOSPITAL_COMMUNITY): Payer: Self-pay | Admitting: *Deleted

## 2019-06-13 DIAGNOSIS — N281 Cyst of kidney, acquired: Secondary | ICD-10-CM | POA: Diagnosis not present

## 2019-06-13 DIAGNOSIS — N200 Calculus of kidney: Secondary | ICD-10-CM | POA: Diagnosis not present

## 2019-06-13 DIAGNOSIS — N135 Crossing vessel and stricture of ureter without hydronephrosis: Secondary | ICD-10-CM

## 2019-06-13 HISTORY — PX: EXTRACORPOREAL SHOCK WAVE LITHOTRIPSY: SHX1557

## 2019-06-13 SURGERY — LITHOTRIPSY, ESWL
Anesthesia: LOCAL | Laterality: Left

## 2019-06-13 MED ORDER — DIAZEPAM 5 MG PO TABS
10.0000 mg | ORAL_TABLET | ORAL | Status: AC
  Administered 2019-06-13: 08:00:00 10 mg via ORAL
  Filled 2019-06-13: qty 2

## 2019-06-13 MED ORDER — DIPHENHYDRAMINE HCL 25 MG PO CAPS
25.0000 mg | ORAL_CAPSULE | ORAL | Status: AC
  Administered 2019-06-13: 08:00:00 25 mg via ORAL
  Filled 2019-06-13: qty 1

## 2019-06-13 MED ORDER — CIPROFLOXACIN HCL 500 MG PO TABS
500.0000 mg | ORAL_TABLET | ORAL | Status: AC
  Administered 2019-06-13: 08:00:00 500 mg via ORAL
  Filled 2019-06-13: qty 1

## 2019-06-13 MED ORDER — SODIUM CHLORIDE 0.9 % IV SOLN
INTRAVENOUS | Status: DC
  Administered 2019-06-13: 08:00:00 via INTRAVENOUS

## 2019-06-13 NOTE — Discharge Instructions (Signed)
1. You should strain your urine and collect all fragments and bring them to your follow up appointment.  °2. You should take your pain medication as needed.  Please call if your pain is severe to the point that it is not controlled with your pain medication. °3. You should call if you develop fever > 101 or persistent nausea or vomiting. °4. Your doctor may prescribe tamsulosin to take to help facilitate stone passage. °

## 2019-06-13 NOTE — Interval H&P Note (Signed)
History and Physical Interval Note:  06/13/2019 8:21 AM  Peter Rice  has presented today for surgery, with the diagnosis of LEFT URETEROPELVIC JUNCTION STONE.  The various methods of treatment have been discussed with the patient and family. After consideration of risks, benefits and other options for treatment, the patient has consented to  Procedure(s): EXTRACORPOREAL SHOCK WAVE LITHOTRIPSY (ESWL) (Left) as a surgical intervention.  The patient's history has been reviewed, patient examined, no change in status, stable for surgery.  I have reviewed the patient's chart and labs.  Questions were answered to the patient's satisfaction.     [employer]

## 2019-06-13 NOTE — Op Note (Signed)
See [facility] operative note scanned into chart. Also because of the size, density, location and other factors that cannot be anticipated I feel this will likely be a staged procedure. This fact supersedes any indication in the scanned [facility] note to the contrary.  

## 2019-06-14 ENCOUNTER — Encounter (HOSPITAL_COMMUNITY): Payer: Self-pay | Admitting: Urology

## 2021-05-25 ENCOUNTER — Other Ambulatory Visit: Payer: Self-pay | Admitting: Urology

## 2021-05-25 DIAGNOSIS — N201 Calculus of ureter: Secondary | ICD-10-CM

## 2021-06-04 NOTE — Progress Notes (Signed)
Talked with patient . States "no pain in 4-2 weeks now, has been trying to get in touch with [facility] and no body returned his call"  I called and left message with Coni. Instructions given arrival time 91 Meds and history reviewed. Wife is the driver. Also states no blue folder, relayed this to [facility] also

## 2021-06-06 NOTE — H&P (Signed)
cc: right flank pain   HPI: 55 year old male with a past medical history of nephrolithiasis who presents today with a sudden onset of right-sided flank pain and discomfort that began at 2:45 a.m. today. This is associated with chills, nausea. He endorses that this feels the same as the stone events in the past. The pain radiates around to his right lower quadrant. The pain waxes and wanes and is severe at times. He is taken tamsulosin, Zofran, hydrocodone for the pain and reports his pain is controlled at this time. His urinalysis does show microscopic hematuria. His last stone event was 2 years ago.   Interval: He presents for reevalaution. He has not passed his right sided stone. He contiues to have intermittent flank pain and nausea. He deies fevers, chills and dysuria.     ALLERGIES: No Allergies    MEDICATIONS: Tamsulosin Hcl 0.4 mg capsule 1 capsule PO Daily  Hydrocodone-Acetaminophen 5 mg-325 mg tablet 1 tablet PO Daily  Ketorolac Tromethamine 10 mg tablet 1 tablet PO Daily     GU PSH: Cystoscopy Insert Stent, Left - 06/11/2019 ESWL, Left - 06/13/2019 Ureteroscopic stone removal - 2009       PSH Notes: Cystoscopy With Ureteroscopy With Removal Of Calculus, Sinus Surgery, Abdominal Surgery, Hand Surgery, Tonsillectomy, Appendectomy, Thoracic Repair, gum surgery, wisdom tooth extraction   NON-GU PSH: Appendectomy - 2009 Remove Tonsils - 2009     GU PMH: Renal and ureteral calculus - 05/11/2021 Flank Pain - 06/27/2019, - 06/11/2019 (Acute), Left, His flank pain and left lower quadrant pain is secondary to his left UPJ stone., - 06/10/2019 Renal cyst, Bilateral, His bilateral renal cysts appear simple - 06/10/2019 LLQ pain, Abdominal pain, LLQ (left lower quadrant) - 2014 Renal calculus, Bilateral kidney stones - 2014 Ureteral calculus, Calculus of ureter - 2014      PMH Notes:  1898-10-17 00:00:00 - Note: Normal Routine History And Physical Adult   NON-GU PMH: Nausea, Nausea -  2014 Other constipation, Chronic Constipation - 2014 GERD    FAMILY HISTORY: None    Notes: Mother still living at age 56  Father still living at age 64  Has 3 daughters   SOCIAL HISTORY: Marital Status: Married Ethnicity: Not Hispanic Or Latino; Race: White Current Smoking Status: Patient has never smoked.   Tobacco Use Assessment Completed: Used Tobacco in last 30 days? Does drink.  Does not use drugs. Drinks 1 caffeinated drink per day. Patient's occupation Museum/gallery conservator.     Notes: Caffeine Use   REVIEW OF SYSTEMS:    GU Review Male:   Patient denies frequent urination, hard to postpone urination, burning/ pain with urination, get up at night to urinate, leakage of urine, stream starts and stops, trouble starting your stream, have to strain to urinate , erection problems, and penile pain.  Gastrointestinal (Upper):   Patient denies nausea, vomiting, and indigestion/ heartburn.  Gastrointestinal (Lower):   Patient denies diarrhea and constipation.  Constitutional:   Patient denies fever, night sweats, weight loss, and fatigue.  Musculoskeletal:   Patient reports back pain. Patient denies joint pain.  Neurological:   Patient denies headaches and dizziness.  Psychologic:   Patient denies depression and anxiety.   Notes: Patient just trying pass a kidney stone for the past week and half    VITAL SIGNS:      05/20/2021 10:07 AM  Weight 232 lb / 105.23 kg  Height 75 in / 190.5 cm  BP 125/89 mmHg  Pulse 89 /min  BMI 29.0  kg/m   MULTI-SYSTEM PHYSICAL EXAMINATION:    Constitutional: Well-nourished. No physical deformities. Normally developed. Good grooming.  Respiratory: No labored breathing, no use of accessory muscles.   Cardiovascular: Normal temperature, normal extremity pulses, no swelling, no varicosities.  Skin: No paleness, no jaundice, no cyanosis. No lesion, no ulcer, no rash.  Neurologic / Psychiatric: Oriented to time, oriented to place, oriented to person.  No depression, no anxiety, no agitation.  Gastrointestinal: No mass, no tenderness, no rigidity, non obese abdomen.     Complexity of Data:  Source Of History:  Patient, Medical Record Summary  Records Review:   Previous Doctor Records, Previous Patient Records  Urine Test Review:   Urinalysis, Urine Culture  X-Ray Review: KUB: Reviewed Films. Discussed With Patient.  [facility] Protocol: Reviewed Films. Reviewed Report.     05/20/21  Urinalysis  Urine Appearance Clear   Urine Color Yellow   Urine Glucose Neg mg/dL  Urine Bilirubin Neg mg/dL  Urine Ketones Neg mg/dL  Urine Specific Gravity 1.015   Urine Blood 1+ ery/uL  Urine pH <=5.0   Urine Protein Trace mg/dL  Urine Urobilinogen 0.2 mg/dL  Urine Nitrites Neg   Urine Leukocyte Esterase Trace leu/uL  Urine WBC/hpf 6 - 10/hpf   Urine RBC/hpf 3 - 10/hpf   Urine Epithelial Cells NS (Not Seen)   Urine Bacteria Few (10-25/hpf)   Urine Mucous Present   Urine Yeast NS (Not Seen)   Urine Trichomonas Not Present   Urine Cystals NS (Not Seen)   Urine Casts Hyaline   Urine Sperm Not Present    PROCEDURES:         KUB - 99371  A single view of the abdomen is obtained. Within the right renal shadow, there are two-three upper pole opacities noted, the largest is approx 6-27mm. Tracing along the right ureter, at the level of L4 there is a 4-27mm opacity consistent with previously seen calculus on CT scan. There are no appreciable opacities within the left renal shadow or along the expected course of the left ureter. There are multiple stable pelvic phleboliths.       Patient confirmed No Neulasta OnPro Device.            Urinalysis w/Scope Dipstick Dipstick Cont'd Micro  Color: Yellow Bilirubin: Neg mg/dL WBC/hpf: 6 - 69/CVE  Appearance: Clear Ketones: Neg mg/dL RBC/hpf: 3 - 93/YBO  Specific Gravity: 1.015 Blood: 1+ ery/uL Bacteria: Few (10-25/hpf)  pH: <=5.0 Protein: Trace mg/dL Cystals: NS (Not Seen)  Glucose: Neg mg/dL  Urobilinogen: 0.2 mg/dL Casts: Hyaline    Nitrites: Neg Trichomonas: Not Present    Leukocyte Esterase: Trace leu/uL Mucous: Present      Epithelial Cells: NS (Not Seen)      Yeast: NS (Not Seen)      Sperm: Not Present    ASSESSMENT:      ICD-10 Details  1 GU:   Renal and ureteral calculus - N20.2 Right, Acute, Systemic Symptoms   PLAN:           Orders X-Rays: KUB          Schedule Return Visit/Planned Activity: Next Available Appointment - Schedule Surgery          Document Letter(s):  Created for Patient: Clinical Summary         Notes:   1. Right ureteral calculus: There is a 58mm stone noted at the level of L4 on KUB. We discussed management of obstructing stones including medical expulsive therapy, ESWL, and  ureteroscopy. He would like to pursue ESWL. Riss of procedure discussed in detail. He understands that there is risk for the procedure to faill and that another surgery may be needed. Advised strict return precautions for any worsening symptomatology including fevers, chills, nausea not controlled by nausea medication or uncontrolled pain. He verbalized his understanding. Surgical posting sheet handed in .

## 2021-06-07 ENCOUNTER — Ambulatory Visit (HOSPITAL_BASED_OUTPATIENT_CLINIC_OR_DEPARTMENT_OTHER)
Admission: RE | Admit: 2021-06-07 | Discharge: 2021-06-07 | Disposition: A | Discharge status: Home / Self Care | Payer: BC Managed Care – PPO | Attending: Urology | Admitting: Urology

## 2021-06-07 ENCOUNTER — Encounter (HOSPITAL_BASED_OUTPATIENT_CLINIC_OR_DEPARTMENT_OTHER)
Admission: RE | Disposition: A | Discharge status: Home / Self Care | Payer: Self-pay | Source: Home / Self Care | Attending: Urology

## 2021-06-07 ENCOUNTER — Encounter (HOSPITAL_BASED_OUTPATIENT_CLINIC_OR_DEPARTMENT_OTHER): Payer: Self-pay | Admitting: Urology

## 2021-06-07 ENCOUNTER — Other Ambulatory Visit: Payer: Self-pay

## 2021-06-07 ENCOUNTER — Ambulatory Visit (HOSPITAL_COMMUNITY): Payer: BC Managed Care – PPO

## 2021-06-07 DIAGNOSIS — Z87442 Personal history of urinary calculi: Secondary | ICD-10-CM | POA: Insufficient documentation

## 2021-06-07 DIAGNOSIS — Z79899 Other long term (current) drug therapy: Secondary | ICD-10-CM | POA: Diagnosis not present

## 2021-06-07 DIAGNOSIS — N201 Calculus of ureter: Secondary | ICD-10-CM | POA: Insufficient documentation

## 2021-06-07 HISTORY — PX: EXTRACORPOREAL SHOCK WAVE LITHOTRIPSY: SHX1557

## 2021-06-07 SURGERY — LITHOTRIPSY, ESWL
Anesthesia: LOCAL | Laterality: Right

## 2021-06-07 MED ORDER — DIPHENHYDRAMINE HCL 25 MG PO CAPS
ORAL_CAPSULE | ORAL | Status: AC
  Filled 2021-06-07: qty 1

## 2021-06-07 MED ORDER — DIAZEPAM 5 MG PO TABS
10.0000 mg | ORAL_TABLET | ORAL | Status: AC
  Administered 2021-06-07: 10 mg via ORAL

## 2021-06-07 MED ORDER — CIPROFLOXACIN HCL 500 MG PO TABS
ORAL_TABLET | ORAL | Status: AC
  Filled 2021-06-07: qty 1

## 2021-06-07 MED ORDER — CIPROFLOXACIN HCL 500 MG PO TABS
500.0000 mg | ORAL_TABLET | ORAL | Status: AC
  Administered 2021-06-07: 500 mg via ORAL

## 2021-06-07 MED ORDER — SODIUM CHLORIDE 0.9 % IV SOLN: INTRAVENOUS | Status: DC

## 2021-06-07 MED ORDER — DIAZEPAM 5 MG PO TABS
ORAL_TABLET | ORAL | Status: AC
  Filled 2021-06-07: qty 2

## 2021-06-07 MED ORDER — SODIUM CHLORIDE 0.9% FLUSH: 3.0000 mL | Freq: Two times a day (BID) | INTRAVENOUS | Status: DC

## 2021-06-07 MED ORDER — DIPHENHYDRAMINE HCL 25 MG PO CAPS
25.0000 mg | ORAL_CAPSULE | ORAL | Status: AC
  Administered 2021-06-07: 25 mg via ORAL

## 2021-06-07 NOTE — Interval H&P Note (Signed)
History and Physical Interval Note:  The stone is now in the right mid ureter on KUB today.  06/07/2021 9:06 AM  Peter Rice  has presented today for surgery, with the diagnosis of RIGHT URETERAL STONE.  The various methods of treatment have been discussed with the patient and family. After consideration of risks, benefits and other options for treatment, the patient has consented to  Procedure(s): EXTRACORPOREAL SHOCK WAVE LITHOTRIPSY (ESWL) (Right) as a surgical intervention.  The patient's history has been reviewed, patient examined, no change in status, stable for surgery.  I have reviewed the patient's chart and labs.  Questions were answered to the patient's satisfaction.     Peter Rice

## 2021-06-07 NOTE — Op Note (Signed)
1st stage right ESWL.  See [facility] note for details.

## 2021-06-08 ENCOUNTER — Encounter (HOSPITAL_BASED_OUTPATIENT_CLINIC_OR_DEPARTMENT_OTHER): Payer: Self-pay | Admitting: Urology

## 2022-05-13 IMAGING — DX DG ABDOMEN 1V
3 series · 3 of 3 positions shown · non-contrast
Comparison: 06/13/2019

CLINICAL DATA: Preop lithotripsy, right urolithiasis

EXAM:
ABDOMEN - 1 VIEW

[abdomen kub (1 of 3)]
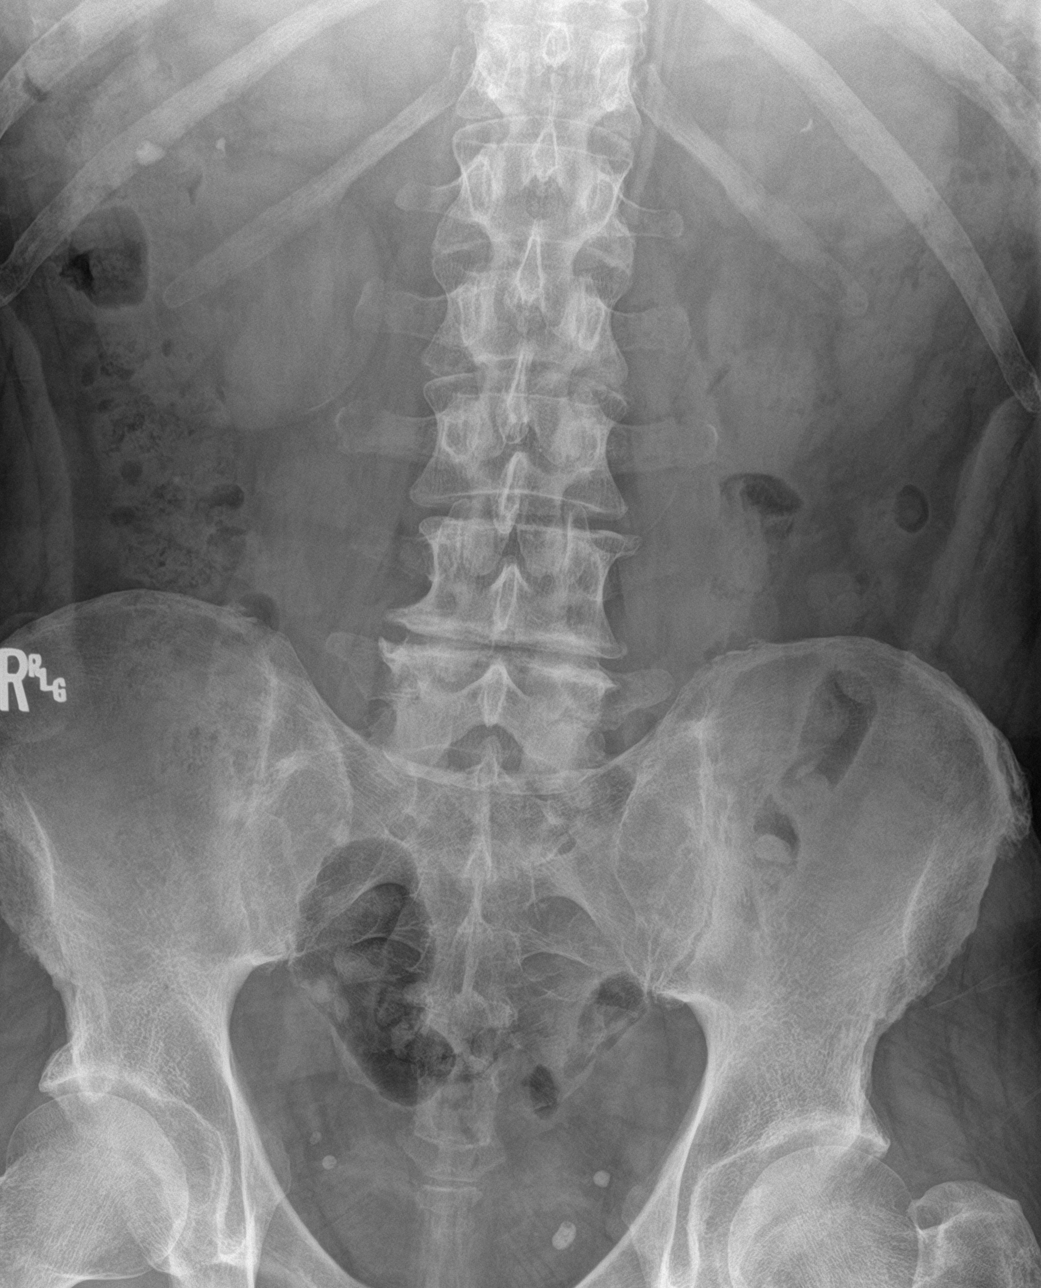

[abdomen kub (2 of 3)]
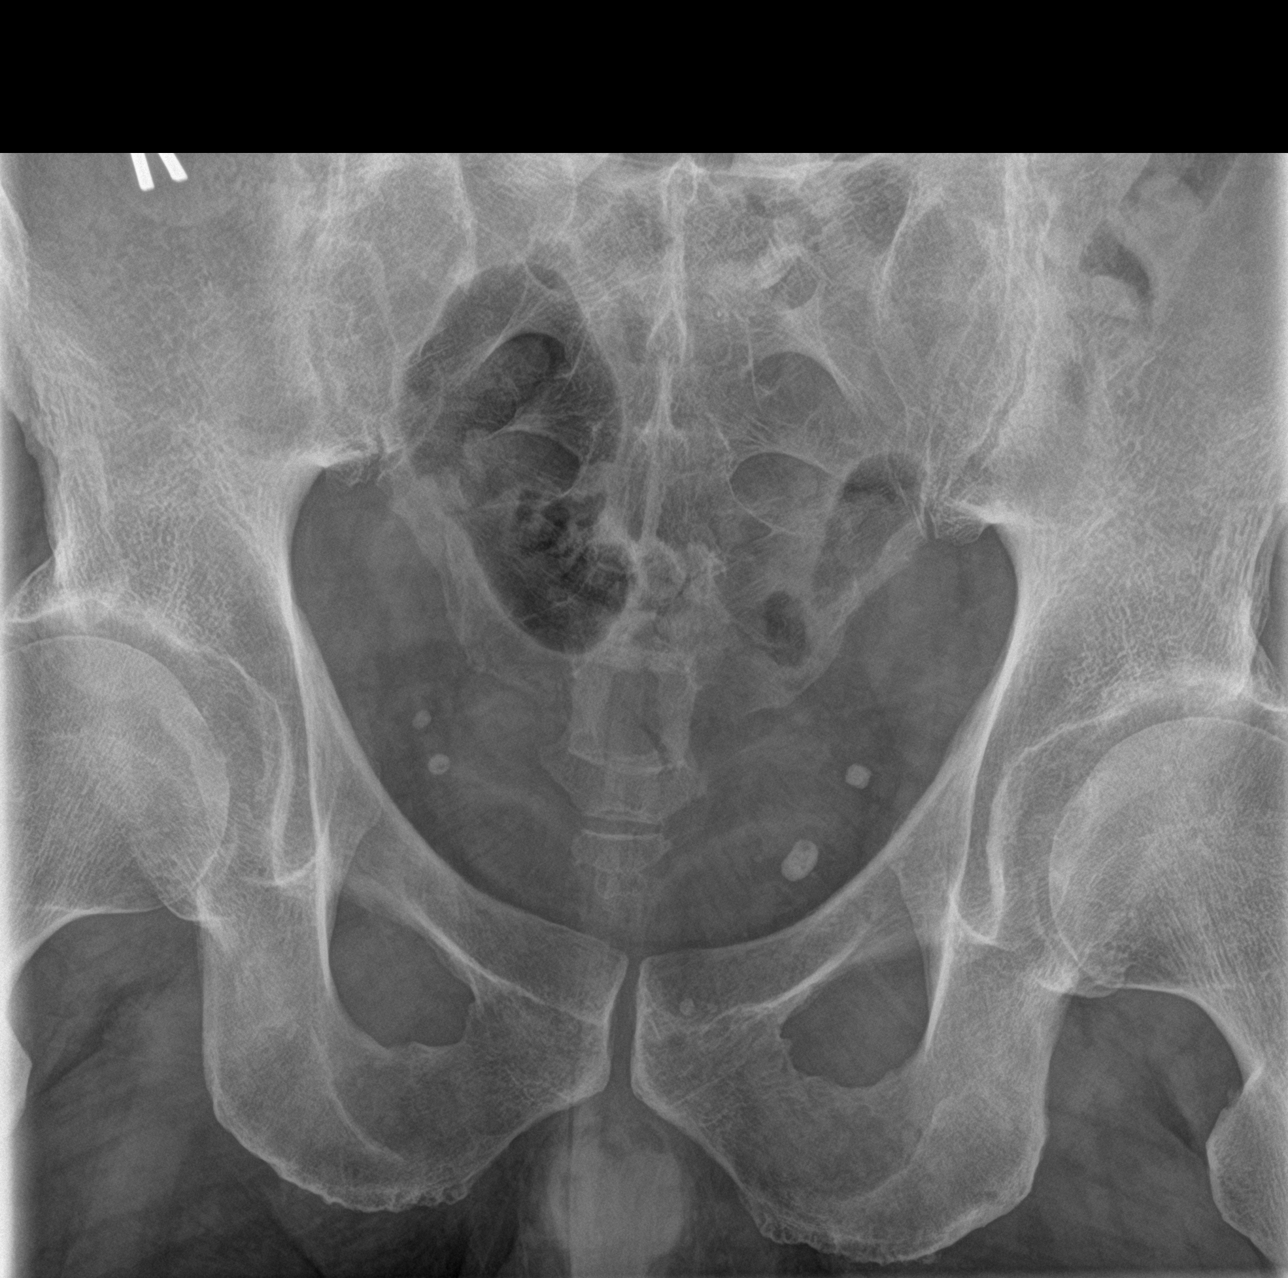

[abdomen kub (3 of 3)]
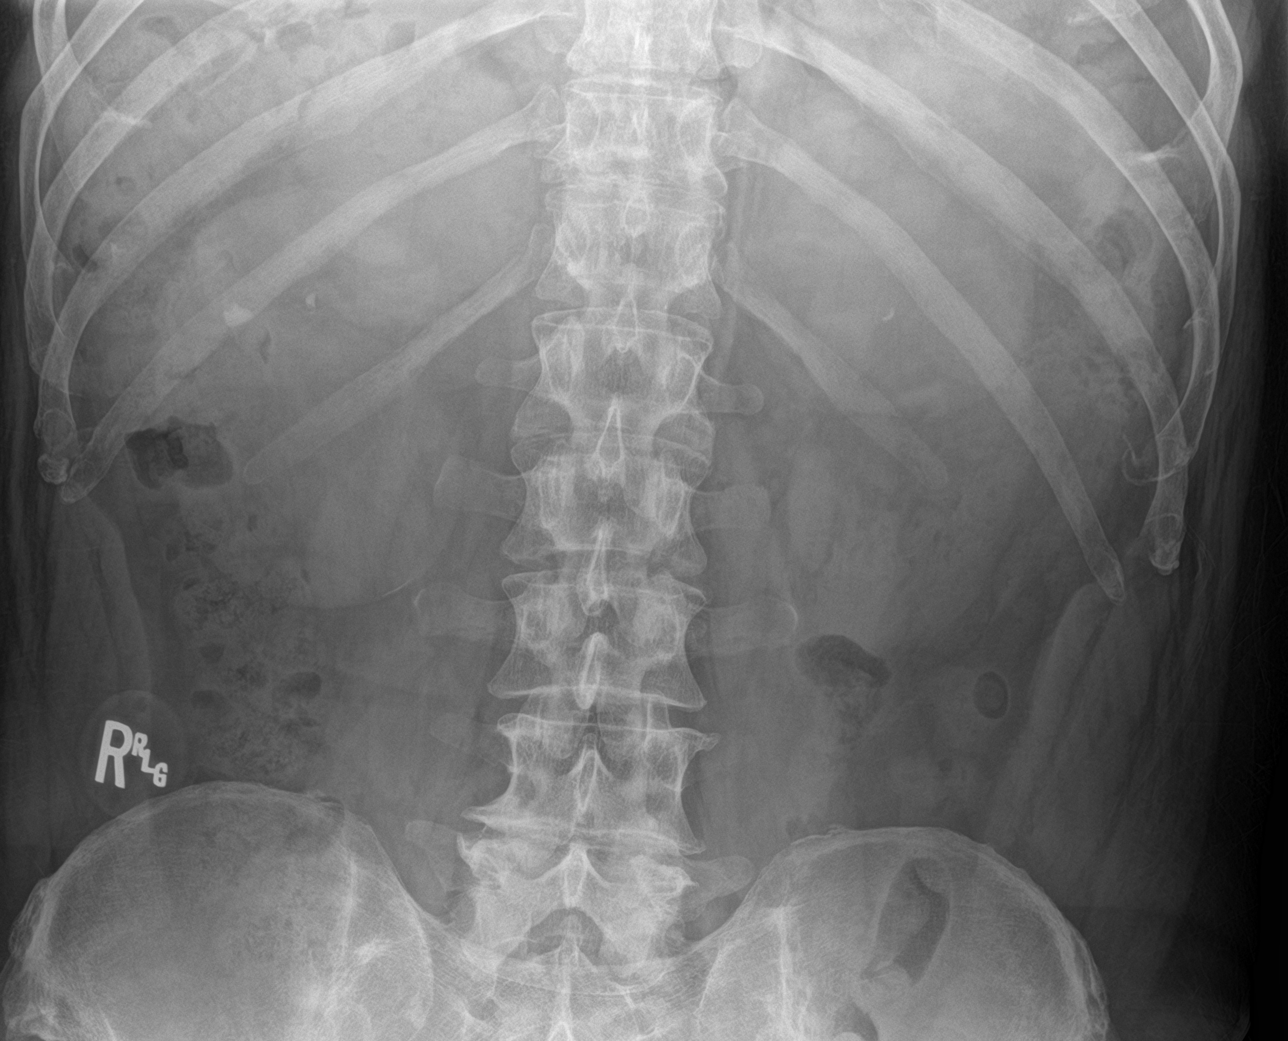

[3 of 3 positions shown; findings below may reference images not displayed]

FINDINGS: 9 mm calcification projects over mid right renal collecting system.
6 mm calcification projects more centrally in the right renal
collecting system. 6 mm calculus projects over the upper pole left
renal collecting system. Stable bilateral pelvic phleboliths. Normal
bowel gas pattern. Mild spondylitic changes in the lower lumbar
spine as before.
IMPRESSION: Bilateral urolithiasis as above.
